# Patient Record
Sex: Male | Born: 1955 | Race: White | Hispanic: No | Marital: Married | Smoking: Current every day smoker
Health system: Southern US, Community
[De-identification: ages and names within clinical notes are randomized; demographics above are authoritative.]

## PROBLEM LIST (undated history)

## (undated) HISTORY — PX: JOINT REPLACEMENT: SHX530

## (undated) HISTORY — PX: CHOLECYSTECTOMY: SHX55

---

## 2006-04-22 ENCOUNTER — Encounter (INDEPENDENT_AMBULATORY_CARE_PROVIDER_SITE_OTHER): Payer: Self-pay | Admitting: *Deleted

## 2006-04-22 ENCOUNTER — Ambulatory Visit (HOSPITAL_COMMUNITY): Admission: RE | Admit: 2006-04-22 | Discharge: 2006-04-22 | Payer: Self-pay | Admitting: *Deleted

## 2007-04-06 ENCOUNTER — Encounter: Admission: RE | Admit: 2007-04-06 | Discharge: 2007-04-06 | Payer: Self-pay | Admitting: Internal Medicine

## 2008-05-03 ENCOUNTER — Ambulatory Visit (HOSPITAL_COMMUNITY): Admission: RE | Admit: 2008-05-03 | Discharge: 2008-05-03 | Payer: Self-pay | Admitting: *Deleted

## 2008-05-03 ENCOUNTER — Encounter (INDEPENDENT_AMBULATORY_CARE_PROVIDER_SITE_OTHER): Payer: Self-pay | Admitting: *Deleted

## 2008-07-07 ENCOUNTER — Encounter (INDEPENDENT_AMBULATORY_CARE_PROVIDER_SITE_OTHER): Payer: Self-pay | Admitting: General Surgery

## 2008-07-07 ENCOUNTER — Encounter: Payer: Self-pay | Admitting: Emergency Medicine

## 2008-07-07 ENCOUNTER — Inpatient Hospital Stay (HOSPITAL_COMMUNITY): Admission: EM | Admit: 2008-07-07 | Discharge: 2008-07-07 | Payer: Self-pay | Admitting: *Deleted

## 2011-02-23 NOTE — H&P (Signed)
NAME:  Erik Castro, Erik Castro              ACCOUNT NO.:  [id number]   MEDICAL RECORD NO.:  [id number]          PATIENT TYPE:  INP   LOCATION:  5154                         FACILITY:  [facility]   PHYSICIAN:  Wilmon Arms. Corliss Skains, M.D. DATE OF BIRTH:  04-01-1956   DATE OF ADMISSION:  07/07/2008  DATE OF DISCHARGE:                              HISTORY & PHYSICAL   PRIMARY CARE PHYSICIAN:  Juline Patch, MD   GASTROENTEROLOGIST:  Georgiana Spinner, MD   CHIEF COMPLAINT:  Epigastric pain.   HISTORY OF PRESENT ILLNESS:  The patient is a 55 year old male in  reasonably good health who presents with a 12-hour history of worsening  abdominal pain that began in his epigastrium with radiation through his  back.  He has also had some nausea.  The patient states that he was in  home, was working around the house when he began having some epigastric  pain.  He states that the pain slowly worsened in the evening.  Very  poor appetite for dinner.  He has also had some nausea, but no vomiting.  The patient thinks that in retrospect he has had similar milder episodes  of epigastric indigestion.  He has been diagnosed with  gastroesophageal reflux disease and has been followed by Dr. Sabino Gasser  for Barrett's esophagus.  His symptoms worsened to the point that he  presented to the [facility] Emergency Department where he was  evaluated.  A CT scan there was performed due to the unavailability of  ultrasound.  The CT scan showed prominence of the gallbladder wall with  some mild pericholecystic fluid and probable calcified gallstone in the  neck of the gallbladder.  The patient received some pain medicine as  well as antibiotics there and is currently asymptomatic.   PAST MEDICAL HISTORY:  Hypercholesterolemia, gastroesophageal reflux  disease with Barrett esophagus followed by Dr. Virginia Rochester.   PAST SURGICAL HISTORY:  Knee scopes.   FAMILY HISTORY:  Noncontributory.   SOCIAL HISTORY:  The patient smokes third of  pack a day and does not  drink alcohol.   ALLERGIES:  He is allergic to [facility].   MEDICATIONS:  Lipitor, Zegerid, and aspirin.   REVIEW OF SYSTEMS:  Otherwise negative.   PHYSICAL EXAMINATION:  VITAL SIGNS:  Temperature 98.2, pulse 54,  respirations 14, blood pressure 117/49.  GENERAL:  Well-developed, well-nourished male in no apparent distress.  HEENT:  EOMI.  Sclerae anicteric.  NECK:  No masses.  No thyromegaly.  LUNGS:  Clear.  Normal respiratory effort.  HEART:  Regular rate and rhythm.  No murmur.  ABDOMEN:  Mild epigastric tenderness.  No rebound or guarding.  No  palpable masses.  EXTREMITIES:  No edema.  SKIN:  Warm and dry.  No sign of jaundice.   LABORATORY DATA:  Lipase 109, white count 10.7, platelet count 294.  Electrolytes within normal limits.  Total bilirubin 0.3, alkaline  phosphatase 82.  CT of the abdomen and pelvis shows chronic gallbladder  wall with some pericholecystic fluid and possible calcified stone in the  neck of the gallbladder.  There is no sign  of intra or extrahepatic duct  dilatation.   IMPRESSION:  Early acute cholecystitis.   PLAN:  Admit for IV antibiotics, n.p.o. status.  We will proceed with a  laparoscopic cholecystectomy tomorrow.      Wilmon Arms. Tsuei, M.D.  Electronically Signed     MKT/MEDQ  D:  07/07/2008  T:  07/07/2008  Job:  [id number]

## 2011-02-23 NOTE — Op Note (Signed)
NAME:  Castro, Erik              ACCOUNT NO.:  [id number]   MEDICAL RECORD NO.:  [id number]          PATIENT TYPE:  INP   LOCATION:  5154                         FACILITY:  [facility]   PHYSICIAN:  Ollen Gross. Vernell Morgans, M.D. DATE OF BIRTH:  August 31, 1956   DATE OF PROCEDURE:  07/07/2008  DATE OF DISCHARGE:  07/07/2008                               OPERATIVE REPORT   PREOPERATIVE DIAGNOSIS:  Cholecystitis with cholelithiasis.   POSTOPERATIVE DIAGNOSIS:  Cholecystitis with cholelithiasis.   PROCEDURE:  Laparoscopic cholecystectomy with intraoperative  cholangiogram.   SURGEON:  Ollen Gross. Vernell Morgans, MD   ASSISTANT:  Anselm Pancoast. Zachery Dakins, MD   ANESTHESIA:  General endotracheal.   PROCEDURE:  After informed consent was obtained, the patient was brought  to the operating room and placed in a supine position on the operating  table.  After adequate induction of general anesthesia, the patient's  abdomen was prepped with Betadine and draped in usual sterile manner.  The area below the umbilicus was infiltrated with 0.25% Marcaine.  A  small incision was made with 15 blade knife and this incision was  carried down through the subcutaneous tissue bluntly with a hemostat and  Army-[employer]y retractors until the linea alba was identified.  The linea  alba was incised with a 15 blade knife and each side was grasped with  Kocher clamps and elevated anteriorly.  The preperitoneal space was then  probed bluntly with a hemostat until the peritoneum was opened and  access was gained to the abdominal cavity.  A 0-Vicryl pursestring  stitch was placed in the fascia around the opening.  Hasson cannula was  placed through the opening and anchored in place with the previously  placed Vicryl purse-string stitch.  The abdomen was then insufflated  with carbon dioxide without difficulty.  The patient was placed in  reverse Trendelenburg position and rotated with the right side up.  Next, the laparoscope was inserted  through the Hasson cannula and the  right upper quadrant was inspected.  The dome of the gallbladder and  liver were readily identified.  Next, the epigastric region was  infiltrated with 0.25% Marcaine.  A small incision was made with a #15  blade knife.  A 10-mm port was placed bluntly through this incision into  the abdominal cavity under direct vision.  Sites were then chosen  laterally on the right side with placement of 5-mm ports.  Each of these  areas were infiltrated with 0.25% Marcaine.  Small stab incisions were  made with a #15 blade knife and 5-mm ports were placed bluntly through  these incisions into the abdominal cavity under direct vision.  A blunt  grasper was placed through the lateral-most 5-mm port and used to grasp  the dome of gallbladder and elevated anteriorly and superiorly.  Another  blunt grasper was placed through the other 5-mm port and used to retract  on the body and neck of the gallbladder.  Dissector was placed through  the epigastric port and using the electrocautery, the peritoneal  reflection at the gallbladder neck was opened.  Blunt dissection was  then  carried within this area until the gallbladder neck and cystic duct  junction was readily identified and a good window was created.  A single  clip was placed on the gallbladder neck.  A small ductotomy was made  just below the clip with the laparoscopic scissors.  A 14-gauge  Angiocath was placed percutaneously through the anterior abdominal wall  under direct vision.  A Reddick cholangiogram catheter was placed  through the Angiocath and flushed.  The Reddick catheter was then placed  in the cystic duct and anchored in place with a clip.  Cholangiogram was  obtained that showed no filling defects, good emptying in duodenum, and  adequate length on the cystic duct.  The anchoring clip and catheters  were removed from the patient.  Three clips were placed proximal on the  cystic duct and duct was  divided between the 2 sets of clips.  Posterior  to this, the cystic artery was identified and again dissected bluntly in  a circumferential manner until a good window was created.  Two clips  placed were proximally and one distally on the artery and the artery was  divided between the two.  Next, the laparoscopic hook cautery device was  used to separate the gallbladder from liver bed.  There was a small  vessel in the gallbladder bed very close to the gallbladder wall that  was also controlled with some clips.  Prior to completely detaching the  gallbladder from the liver bed, the liver bed was inspected and several  small bleeding points were coagulated with electrocautery until the area  was completely hemostatic.  The gallbladder was then detached rest away  from the liver bed without difficulty with the hook cautery.  A  laparoscopic bag was inserted through the epigastric port.  The  gallbladder was placed in the bag and the bag was sealed.  The abdomen  was then irrigated with copious amounts of saline until the effluent was  clear.  The laparoscope was then moved to the epigastric port.  A  gallbladder grasper was placed through the Hasson cannula and used to  grasp the opening of the bag.  The bag with the gallbladder was removed  through the infraumbilical port without difficulty.  The fascial defect  was closed with the previously placed Vicryl purse-string stitch as well  as with another figure-of-eight interrupted 0-Vicryl stitch.  The rest  of ports were removed under direct vision and found to be hemostatic.  Gas was allowed to escape.  The skin incisions were all closed with  interrupted 4-0 Monocryl subcuticular stitches and Dermabond dressings  were applied.  The patient tolerated the procedure well.  At the end of  the case, all needle, sponge, and instrument counts were correct.  The  patient was then awakened and taken to recovery room in stable  condition.       Ollen Gross. Vernell Morgans, M.D.  Electronically Signed     PST/MEDQ  D:  07/07/2008  T:  07/07/2008  Job:  [id number]

## 2011-02-23 NOTE — Op Note (Signed)
NAME:  Erik Castro, Erik Castro              ACCOUNT NO.:  [id number]   MEDICAL RECORD NO.:  [id number]          PATIENT TYPE:  AMB   LOCATION:  ENDO                         FACILITY:  [facility]   PHYSICIAN:  Georgiana Spinner, M.D.    DATE OF BIRTH:  10/01/1956   DATE OF PROCEDURE:  DATE OF DISCHARGE:                               OPERATIVE REPORT   PROCEDURE:  Upper endoscopy with biopsy.   INDICATIONS:  Barrett's esophagus.   ANESTHESIA:  Fentanyl 50 mcg, Versed 6 mg.   DESCRIPTION OF PROCEDURE:  With the patient mildly sedated in the left  lateral decubitus position, the Pentax videoscopic endoscope was  inserted in the mouth and passed under direct vision through the  esophagus until we reached the distal esophagus, and there were changes  of Barrett's esophagus and also what appeared to be a ring, Schatzki's  type.  This was photographed.  The area of Barrett's was biopsied.  There were two particular areas seen.  We entered into the stomach.  The  fundus, body, antrum, duodenal bulb and second portion of the duodenum  were visualized.  From this point, the endoscope was slowly withdrawn,  taking circumferential views of the duodenal mucosa until the endoscope  had been pulled back into the stomach and placed in retroflexion to view  the stomach from below.  The endoscope was straightened and withdrawn,  taking circumferential views of the remaining gastric and esophageal  mucosa, stopping in the proximal esophagus where what appeared to be a  papilloma was photographed and biopsied.  The endoscope was withdrawn.  The patient's vital signs and pulse oximeter remained stable.  The  patient tolerated the procedure well without apparent complication.   FINDINGS:  Proximal esophageal papilloma, biopsied.  Distal esophagus  Barrett's esophagus biopsied along with a Schatzki's ring which was  photographed only.  A hiatal hernia was noted.   PLAN:  Await biopsy report.  The patient will call me  for results and  follow-up with me as an outpatient.           ______________________________  Georgiana Spinner, M.D.     GMO/MEDQ  D:  05/03/2008  T:  05/03/2008  Job:  [id number]

## 2011-02-26 ENCOUNTER — Emergency Department (INDEPENDENT_AMBULATORY_CARE_PROVIDER_SITE_OTHER): Payer: 59

## 2011-02-26 ENCOUNTER — Emergency Department (HOSPITAL_BASED_OUTPATIENT_CLINIC_OR_DEPARTMENT_OTHER)
Admission: EM | Admit: 2011-02-26 | Discharge: 2011-02-26 | Disposition: A | Discharge status: Home / Self Care | Payer: 59 | Attending: Emergency Medicine | Admitting: Emergency Medicine

## 2011-02-26 DIAGNOSIS — W298XXA Contact with other powered powered hand tools and household machinery, initial encounter: Secondary | ICD-10-CM | POA: Insufficient documentation

## 2011-02-26 DIAGNOSIS — Y92009 Unspecified place in unspecified non-institutional (private) residence as the place of occurrence of the external cause: Secondary | ICD-10-CM | POA: Insufficient documentation

## 2011-02-26 DIAGNOSIS — S61209A Unspecified open wound of unspecified finger without damage to nail, initial encounter: Secondary | ICD-10-CM

## 2011-02-26 DIAGNOSIS — F17200 Nicotine dependence, unspecified, uncomplicated: Secondary | ICD-10-CM | POA: Insufficient documentation

## 2011-02-26 DIAGNOSIS — S61409A Unspecified open wound of unspecified hand, initial encounter: Secondary | ICD-10-CM | POA: Insufficient documentation

## 2011-02-26 DIAGNOSIS — E785 Hyperlipidemia, unspecified: Secondary | ICD-10-CM | POA: Insufficient documentation

## 2011-02-26 DIAGNOSIS — K219 Gastro-esophageal reflux disease without esophagitis: Secondary | ICD-10-CM | POA: Insufficient documentation

## 2011-02-26 DIAGNOSIS — W312XXA Contact with powered woodworking and forming machines, initial encounter: Secondary | ICD-10-CM

## 2011-02-26 NOTE — Op Note (Signed)
NAME:  Erik Castro, Erik Castro              ACCOUNT NO.:  [id number]   MEDICAL RECORD NO.:  [id number]          PATIENT TYPE:  AMB   LOCATION:  ENDO                         FACILITY:  [facility]   PHYSICIAN:  Georgiana Spinner, M.D.    DATE OF BIRTH:  1956/02/12   DATE OF PROCEDURE:  DATE OF DISCHARGE:                                 OPERATIVE REPORT   PROCEDURE:  Colonoscopy.   INDICATIONS:  Colon cancer screening.   ANESTHESIA:  Fentanyl 25 mcg, Versed 2 mg.   PROCEDURE:  With the patient mildly sedated in the left lateral decubitus  position, the Olympus videoscopic colonoscope was inserted in the rectum  after a rectal examination was performed and was unremarkable.  Subsequently, the scope was passed under direct vision with pressure applied  to the abdomen, we reached the cecum, identified by ileocecal valve and  appendiceal orifice both of which were photographed.  From this point, the  colonoscope was slowly withdrawn taking circumferential views of colonic  mucosa stopping only in the rectum, which appeared normal on direct  and  showed hemorrhoids on retroflex view.  The endoscope was straightened and  withdrawn.  The patient's vital signs, pulse oximeter remained stable.  The  patient tolerated procedure well with no apparent complications.   FINDINGS:  Internal hemorrhoids otherwise unremarkable colonoscopic  examination to the cecum.   PLAN:  See endoscopy note for further details.           ______________________________  Georgiana Spinner, M.D.     GMO/MEDQ  D:  04/22/2006  T:  04/22/2006  Job:  [id number]

## 2011-02-26 NOTE — Op Note (Signed)
NAME:  Erik Castro, Erik Castro              ACCOUNT NO.:  [id number]   MEDICAL RECORD NO.:  [id number]          PATIENT TYPE:  AMB   LOCATION:  ENDO                         FACILITY:  [facility]   PHYSICIAN:  Georgiana Spinner, M.D.    DATE OF BIRTH:  1956/01/20   DATE OF PROCEDURE:  04/22/2006  DATE OF DISCHARGE:                                 OPERATIVE REPORT   PROCEDURE:  Upper endoscopy.   INDICATIONS:  GERD.   ANESTHESIA:  Fentanyl 100 mcg, Versed 10 mg.   PROCEDURE:  With the patient mildly sedated in the left lateral decubitus  position, the Olympus videoscopic endoscope was inserted in the mouth,  passed under direct vision through the esophagus and at midesophagus, there  appeared to be a papilloma, which was photographed and subsequently  biopsied.  We advanced past this to the distal esophagus, and there appeared  to be an early stricture.  The patient had some dysphagia, which had cleared  on Zegerid therapy, and there were also changes of Barrett's esophagus,  photographed and biopsied.  We entered into the stomach through a hiatal  hernia.  The fundus, body, antrum, duodenal bulb, second portion of duodenum  were visualized.  From this point the endoscope was slowly withdrawn taking  circumferential views of the duodenal mucosa until the endoscope had been  pulled back into the stomach, placed in retroflexion to view the stomach  from below.  The endoscope was straightened and withdrawn taking  circumferential views of the remaining gastric and esophageal mucosa.  The  patient's vital signs and pulse oximetry remained stable.  The patient  tolerated procedure well without apparent complications.   FINDINGS:  1.  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  2.  Barrett's esophagus, biopsied.  3.  A papilloma, presumably biopsied.   Await biopsy reports.  The patient will call me for results and follow up  with me as an outpatient.  Proceed to colonoscopy as  planned.           ______________________________  Georgiana Spinner, M.D.     GMO/MEDQ  D:  04/22/2006  T:  04/22/2006  Job:  [id number]

## 2011-03-01 ENCOUNTER — Emergency Department (HOSPITAL_BASED_OUTPATIENT_CLINIC_OR_DEPARTMENT_OTHER)
Admission: EM | Admit: 2011-03-01 | Discharge: 2011-03-01 | Disposition: A | Discharge status: Home / Self Care | Payer: 59 | Attending: Emergency Medicine | Admitting: Emergency Medicine

## 2011-03-01 DIAGNOSIS — E785 Hyperlipidemia, unspecified: Secondary | ICD-10-CM | POA: Insufficient documentation

## 2011-03-01 DIAGNOSIS — K219 Gastro-esophageal reflux disease without esophagitis: Secondary | ICD-10-CM | POA: Insufficient documentation

## 2011-03-01 DIAGNOSIS — Z09 Encounter for follow-up examination after completed treatment for conditions other than malignant neoplasm: Secondary | ICD-10-CM | POA: Insufficient documentation

## 2011-03-01 DIAGNOSIS — F17200 Nicotine dependence, unspecified, uncomplicated: Secondary | ICD-10-CM | POA: Insufficient documentation

## 2011-07-12 LAB — DIFFERENTIAL
Basophils Absolute: 0.1
Basophils Relative: 1
Eosinophils Relative: 1
Lymphocytes Relative: 22

## 2011-07-12 LAB — CBC
HCT: 41.3
MCV: 91.7
RBC: 4.51
WBC: 10.7 — ABNORMAL HIGH

## 2011-07-12 LAB — COMPREHENSIVE METABOLIC PANEL
AST: 23
Alkaline Phosphatase: 82
BUN: 13
CO2: 28
Chloride: 105
Creatinine, Ser: 1
GFR calc non Af Amer: >60
Total Bilirubin: 0.3

## 2011-07-12 LAB — LIPASE, BLOOD: Lipase: 109

## 2011-07-12 LAB — POCT CARDIAC MARKERS: Troponin i, poc: <0.05

## 2014-05-14 ENCOUNTER — Other Ambulatory Visit: Payer: Self-pay | Admitting: Internal Medicine

## 2014-05-14 DIAGNOSIS — F17200 Nicotine dependence, unspecified, uncomplicated: Secondary | ICD-10-CM

## 2014-05-22 ENCOUNTER — Ambulatory Visit
Admission: RE | Admit: 2014-05-22 | Discharge: 2014-05-22 | Disposition: A | Discharge status: Home / Self Care | Payer: No Typology Code available for payment source | Source: Ambulatory Visit | Attending: Internal Medicine | Admitting: Internal Medicine

## 2014-05-22 DIAGNOSIS — F17200 Nicotine dependence, unspecified, uncomplicated: Secondary | ICD-10-CM

## 2014-06-28 ENCOUNTER — Other Ambulatory Visit: Payer: Self-pay | Admitting: Gastroenterology

## 2018-05-12 ENCOUNTER — Other Ambulatory Visit: Payer: Self-pay | Admitting: Gastroenterology

## 2018-05-30 ENCOUNTER — Encounter (HOSPITAL_COMMUNITY): Payer: Self-pay | Admitting: *Deleted

## 2018-05-30 ENCOUNTER — Other Ambulatory Visit: Payer: Self-pay | Admitting: Gastroenterology

## 2018-05-30 NOTE — Anesthesia Preprocedure Evaluation (Signed)
Anesthesia Evaluation  Patient identified by MRN, date of birth, ID band Patient awake    Reviewed: Allergy & Precautions, H&P , NPO status , Patient's Chart, lab work & pertinent test results, reviewed documented beta blocker date and time   Airway Mallampati: II  TM Distance: >3 FB Neck ROM: full    Dental no notable dental hx.    Pulmonary neg pulmonary ROS, Current Smoker,    Pulmonary exam normal breath sounds clear to auscultation       Cardiovascular Exercise Tolerance: Good negative cardio ROS   Rhythm:regular Rate:Normal     Neuro/Psych negative neurological ROS  negative psych ROS   GI/Hepatic negative GI ROS, Neg liver ROS, GERD  ,  Endo/Other  negative endocrine ROS  Renal/GU negative Renal ROS  negative genitourinary   Musculoskeletal   Abdominal   Peds  Hematology negative hematology ROS (+)   Anesthesia Other Findings   Reproductive/Obstetrics negative OB ROS                             Anesthesia Physical Anesthesia Plan  ASA: II  Anesthesia Plan: MAC   Post-op Pain Management:    Induction: Intravenous  PONV Risk Score and Plan: 1 and Treatment may vary due to age or medical condition  Airway Management Planned: Mask, Natural Airway and Nasal Cannula  Additional Equipment:   Intra-op Plan:   Post-operative Plan:   Informed Consent: I have reviewed the patients History and Physical, chart, labs and discussed the procedure including the risks, benefits and alternatives for the proposed anesthesia with the patient or authorized representative who has indicated his/her understanding and acceptance.   Dental Advisory Given  Plan Discussed with: CRNA, Anesthesiologist and Surgeon  Anesthesia Plan Comments:         Anesthesia Quick Evaluation

## 2018-05-31 ENCOUNTER — Ambulatory Visit (HOSPITAL_COMMUNITY): Payer: 59 | Admitting: Certified Registered Nurse Anesthetist

## 2018-05-31 ENCOUNTER — Encounter (HOSPITAL_COMMUNITY)
Admission: RE | Disposition: A | Discharge status: Home / Self Care | Payer: Self-pay | Source: Ambulatory Visit | Attending: Gastroenterology

## 2018-05-31 ENCOUNTER — Other Ambulatory Visit: Payer: Self-pay

## 2018-05-31 ENCOUNTER — Ambulatory Visit (HOSPITAL_COMMUNITY)
Admission: RE | Admit: 2018-05-31 | Discharge: 2018-05-31 | Disposition: A | Discharge status: Home / Self Care | Payer: 59 | Source: Ambulatory Visit | Attending: Gastroenterology | Admitting: Gastroenterology

## 2018-05-31 ENCOUNTER — Encounter (HOSPITAL_COMMUNITY): Payer: Self-pay

## 2018-05-31 DIAGNOSIS — F17200 Nicotine dependence, unspecified, uncomplicated: Secondary | ICD-10-CM | POA: Insufficient documentation

## 2018-05-31 DIAGNOSIS — K219 Gastro-esophageal reflux disease without esophagitis: Secondary | ICD-10-CM | POA: Diagnosis not present

## 2018-05-31 DIAGNOSIS — Z888 Allergy status to other drugs, medicaments and biological substances status: Secondary | ICD-10-CM | POA: Insufficient documentation

## 2018-05-31 DIAGNOSIS — D130 Benign neoplasm of esophagus: Secondary | ICD-10-CM | POA: Insufficient documentation

## 2018-05-31 DIAGNOSIS — K2970 Gastritis, unspecified, without bleeding: Secondary | ICD-10-CM | POA: Diagnosis not present

## 2018-05-31 DIAGNOSIS — Z7982 Long term (current) use of aspirin: Secondary | ICD-10-CM | POA: Insufficient documentation

## 2018-05-31 DIAGNOSIS — Z79899 Other long term (current) drug therapy: Secondary | ICD-10-CM | POA: Insufficient documentation

## 2018-05-31 DIAGNOSIS — K449 Diaphragmatic hernia without obstruction or gangrene: Secondary | ICD-10-CM | POA: Insufficient documentation

## 2018-05-31 HISTORY — PX: ESOPHAGOGASTRODUODENOSCOPY (EGD) WITH PROPOFOL: SHX5813

## 2018-05-31 HISTORY — PX: POLYPECTOMY: SHX5525

## 2018-05-31 SURGERY — ESOPHAGOGASTRODUODENOSCOPY (EGD) WITH PROPOFOL
Anesthesia: Monitor Anesthesia Care

## 2018-05-31 MED ORDER — LIDOCAINE 2% (20 MG/ML) 5 ML SYRINGE
INTRAMUSCULAR | Status: DC | PRN
  Administered 2018-05-31: 80 mg via INTRAVENOUS

## 2018-05-31 MED ORDER — ONDANSETRON HCL 4 MG/2ML IJ SOLN
INTRAMUSCULAR | Status: DC | PRN
  Administered 2018-05-31: 4 mg via INTRAVENOUS

## 2018-05-31 MED ORDER — LACTATED RINGERS IV SOLN
INTRAVENOUS | Status: DC
  Administered 2018-05-31: 08:00:00 via INTRAVENOUS

## 2018-05-31 MED ORDER — SODIUM CHLORIDE 0.9 % IV SOLN: INTRAVENOUS | Status: DC

## 2018-05-31 MED ORDER — GLYCOPYRROLATE 0.2 MG/ML IJ SOLN
INTRAMUSCULAR | Status: DC | PRN
  Administered 2018-05-31: 0.2 mg via INTRAVENOUS

## 2018-05-31 MED ORDER — PROPOFOL 500 MG/50ML IV EMUL
INTRAVENOUS | Status: DC | PRN
  Administered 2018-05-31: 100 ug/kg/min via INTRAVENOUS

## 2018-05-31 MED ORDER — PROPOFOL 10 MG/ML IV BOLUS
INTRAVENOUS | Status: DC | PRN
  Administered 2018-05-31: 30 mg via INTRAVENOUS

## 2018-05-31 MED ORDER — PROPOFOL 10 MG/ML IV BOLUS
INTRAVENOUS | Status: AC
  Filled 2018-05-31: qty 40

## 2018-05-31 SURGICAL SUPPLY — 14 items

## 2018-05-31 NOTE — Transfer of Care (Signed)
Immediate Anesthesia Transfer of Care Note  Patient: Erik Castro  Procedure(s) Performed: ESOPHAGOGASTRODUODENOSCOPY (EGD) WITH PROPOFOL (N/A ) POLYPECTOMY  Patient Location: PACU  Anesthesia Type:MAC  Level of Consciousness: awake, alert , oriented and patient cooperative  Airway & Oxygen Therapy: Patient Spontanous Breathing and Patient connected to nasal cannula oxygen  Post-op Assessment: Report given to RN and Post -op Vital signs reviewed and stable  Post vital signs: Reviewed and stable  Last Vitals:  Vitals Value Taken Time  BP    Temp    Pulse    Resp    SpO2      Last Pain:  Vitals:   05/31/18 0806  TempSrc: Oral  PainSc: 0-No pain         Complications: No apparent anesthesia complications

## 2018-05-31 NOTE — Discharge Instructions (Signed)

## 2018-05-31 NOTE — Op Note (Signed)
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 Estimated Blood Loss:     Estimated blood loss: none. Procedure:                Pre-Anesthesia Assessment:                           - Prior to the procedure, a History and Physical                            was performed, and patient medications and                            allergies were reviewed. The patient's tolerance of                            previous anesthesia was also reviewed. The risks                            and benefits of the procedure and the sedation                            options and risks were discussed with the patient.                            All questions were answered, and informed consent                            was obtained. Prior Anticoagulants: The patient has                            taken aspirin. ASA Grade Assessment: II - A patient                            with mild systemic disease. After reviewing the  risks and benefits, the patient was deemed in                            satisfactory condition to undergo the procedure.                           After obtaining informed consent, the endoscope was                            passed under direct vision. Throughout the                            procedure, the patient's blood pressure, pulse, and             oxygen saturations were monitored continuously. The                            GIF-H190 ([id number]) Olympus adult endoscope was                            introduced through the mouth, and advanced to the                            second part of duodenum. The upper GI endoscopy was                            accomplished without difficulty. The patient                            tolerated the procedure well. Scope In: Scope Out: Findings:      A single 6 mm polyp, frond-like and on small stalk with no bleeding was       found 30 cm from the incisors. The polyp was removed with a hot snare.       Resection and retrieval were complete. Estimated blood loss: none.      A small hiatal hernia was present.      The exam of the esophagus was otherwise normal.      Patchy minimal inflammation was found in the entire examined stomach.      The exam of the stomach was otherwise normal.      The duodenal bulb, first portion of the duodenum and second portion of       the duodenum were normal. Impression:               - Esophageal polyp found. Resected and retrieved.                           - Small hiatal hernia.                           - Gastritis.                           - Normal duodenal bulb, first portion of the  duodenum and second portion of the duodenum. Moderate Sedation:      N/A- Per Anesthesia Care Recommendation:           - Patient has a contact number available for                            emergencies. The signs and symptoms of potential                            delayed complications were discussed with the                            patient. Return to normal activities tomorrow.                            Written discharge instructions were provided to the                            patient.                           - Discharge patient to home (ambulatory).                           - Resume previous diet today.                            - Continue present medications.                           - Await pathology results.                           - Repeat upper endoscopy (date not yet determined)                            for surveillance based on pathology results. May                            consider follow-up endoscopy in 1 year.                           - Return to GI clinic PRN.                           - Return to referring physician as previously                            scheduled. Procedure Code(s):        --- Professional ---                           [phone], Esophagogastroduodenoscopy, flexible,                            transoral; with removal of tumor(s), polyp(s), or  other lesion(s) by snare technique Diagnosis Code(s):        --- Professional ---                           K22.8, Other specified diseases of esophagus                           K44.9, Diaphragmatic hernia without obstruction or                            gangrene                           K29.70, Gastritis, unspecified, without bleeding                           D13.0, Benign neoplasm of esophagus CPT copyright 2017 [employer]. All rights reserved. The codes documented in this report are preliminary and upon coder review may  be revised to meet current compliance requirements. Arta Silence, MD 05/31/2018 9:06:34 AM This report has been signed electronically. Number of Addenda: 0

## 2018-05-31 NOTE — Anesthesia Postprocedure Evaluation (Signed)
Anesthesia Post Note  Patient: Erik Castro  Procedure(s) Performed: ESOPHAGOGASTRODUODENOSCOPY (EGD) WITH PROPOFOL (N/A ) POLYPECTOMY     Patient location during evaluation: PACU Anesthesia Type: MAC Level of consciousness: awake and alert Pain management: pain level controlled Vital Signs Assessment: post-procedure vital signs reviewed and stable Respiratory status: spontaneous breathing, nonlabored ventilation, respiratory function stable and patient connected to nasal cannula oxygen Cardiovascular status: stable and blood pressure returned to baseline Postop Assessment: no apparent nausea or vomiting Anesthetic complications: no    Last Vitals:  Vitals:   05/31/18 0925 05/31/18 0930  BP: (!) 119/52   Pulse: (!) 56   Resp: 17   Temp:    SpO2: 98% 100%    Last Pain:  Vitals:   05/31/18 0911  TempSrc: Oral  PainSc: 0-No pain                 Alannie Amodio

## 2018-05-31 NOTE — H&P (Signed)
Patient interval history reviewed.  Patient examined again.  There has been no change from documented H/P dated 05/30/18 (scanned into chart from our office) except as documented above.  Assessment:  1.  Esophageal polyp (squamous papilloma).  Plan:  1.  Endoscopy with anticipated esophageal polyp removal. 2.  Risks (bleeding, infection, bowel perforation that could require surgery, sedation-related changes in cardiopulmonary systems), benefits (identification and possible treatment of source of symptoms, exclusion of certain causes of symptoms), and alternatives (watchful waiting, radiographic imaging studies, empiric medical treatment) of upper endoscopy (EGD) were explained to patient/family in detail and patient wishes to proceed.

## 2018-05-31 NOTE — Anesthesia Procedure Notes (Signed)
Procedure Name: MAC Date/Time: 05/31/2018 8:41 AM Performed by: West Pugh, CRNA Pre-anesthesia Checklist: Patient identified, Emergency Drugs available, Suction available, Patient being monitored and Timeout performed Patient Re-evaluated:Patient Re-evaluated prior to induction Oxygen Delivery Method: Nasal cannula Preoxygenation: Pre-oxygenation with 100% oxygen Induction Type: IV induction Placement Confirmation: positive ETCO2 Dental Injury: Teeth and Oropharynx as per pre-operative assessment

## 2018-06-01 ENCOUNTER — Encounter (HOSPITAL_COMMUNITY): Payer: Self-pay | Admitting: Gastroenterology

## 2020-03-14 ENCOUNTER — Encounter (HOSPITAL_COMMUNITY): Payer: Self-pay | Admitting: Emergency Medicine

## 2020-03-14 ENCOUNTER — Other Ambulatory Visit: Payer: Self-pay

## 2020-03-14 ENCOUNTER — Emergency Department (HOSPITAL_COMMUNITY): Payer: 59

## 2020-03-14 ENCOUNTER — Emergency Department (HOSPITAL_COMMUNITY)
Admission: EM | Admit: 2020-03-14 | Discharge: 2020-03-14 | Disposition: A | Discharge status: Home / Self Care | Payer: 59 | Attending: Emergency Medicine | Admitting: Emergency Medicine

## 2020-03-14 DIAGNOSIS — W208XXA Other cause of strike by thrown, projected or falling object, initial encounter: Secondary | ICD-10-CM | POA: Insufficient documentation

## 2020-03-14 DIAGNOSIS — Z79899 Other long term (current) drug therapy: Secondary | ICD-10-CM | POA: Insufficient documentation

## 2020-03-14 DIAGNOSIS — Z7982 Long term (current) use of aspirin: Secondary | ICD-10-CM | POA: Diagnosis not present

## 2020-03-14 DIAGNOSIS — R52 Pain, unspecified: Secondary | ICD-10-CM

## 2020-03-14 DIAGNOSIS — Y999 Unspecified external cause status: Secondary | ICD-10-CM | POA: Diagnosis not present

## 2020-03-14 DIAGNOSIS — F17210 Nicotine dependence, cigarettes, uncomplicated: Secondary | ICD-10-CM | POA: Diagnosis not present

## 2020-03-14 DIAGNOSIS — S92421A Displaced fracture of distal phalanx of right great toe, initial encounter for closed fracture: Secondary | ICD-10-CM | POA: Insufficient documentation

## 2020-03-14 DIAGNOSIS — Y929 Unspecified place or not applicable: Secondary | ICD-10-CM | POA: Insufficient documentation

## 2020-03-14 DIAGNOSIS — Y939 Activity, unspecified: Secondary | ICD-10-CM | POA: Insufficient documentation

## 2020-03-14 DIAGNOSIS — S99921A Unspecified injury of right foot, initial encounter: Secondary | ICD-10-CM | POA: Diagnosis present

## 2020-03-14 MED ORDER — HYDROCODONE-ACETAMINOPHEN 5-325 MG PO TABS
1.0000 | ORAL_TABLET | Freq: Once | ORAL | Status: AC
  Administered 2020-03-14: 1 via ORAL
  Filled 2020-03-14: qty 1

## 2020-03-14 MED ORDER — HYDROCODONE-ACETAMINOPHEN 5-325 MG PO TABS: 1.0000 | ORAL_TABLET | Freq: Four times a day (QID) | ORAL | 0 refills | Status: AC | PRN

## 2020-03-14 NOTE — ED Notes (Signed)
Patient Alert and oriented to baseline. Stable and ambulatory to baseline. Patient verbalized understanding of the discharge instructions.  Patient belongings were taken by the patient.   

## 2020-03-14 NOTE — Discharge Instructions (Addendum)
Please read instructions below. Keep the splint clean, dry and in place at all times. Avoid weight bearing on your foot until cleared by orthopedics. Elevate your foot as much as possible. You can take hydrocodone every 6 hours as needed for severe pain. Be aware there is tylenol/acetaminophen in this medication. Hydrocodone can make you drowsy, don't drive while taking. Call the orthopedic specialist office the next business day to schedule an appointment for repeat xray and follow up on your injury. Return to the ED for concerning symptoms.

## 2020-03-14 NOTE — ED Triage Notes (Addendum)
Patient arrives to ED with complaints of an injury to his right big toe after a log fell on it yesterday.

## 2020-03-14 NOTE — ED Provider Notes (Signed)
[facility] Provider Note   CSN: [id number] Arrival date & time: 03/14/20  1157     History Chief Complaint  Patient presents with  . Toe Injury    Erik Castro is a 64 y.o. male presenting to the emergency department with complaint of right great toe injury that occurred yesterday.  He states a wooden pallet fell onto his foot.  He had sudden onset of pain with gradual onset of swelling overnight.  He has associated bruising.  He woke up with blistering to the top of his toe.  One of the blisters burst while in the waiting room here.  He provides a picture of his toe soon after injury, there is no wound present.  Patient confirms there were no wounds initially after injury.  He has applied ice.  He went to urgent care to be seen however they sent him to the ED for evaluation. Denies hx of DM or vascular insufficiency.  The history is provided by the patient.       History reviewed. No pertinent past medical history.  There are no problems to display for this patient.   Past Surgical History:  Procedure Laterality Date  . CHOLECYSTECTOMY    . ESOPHAGOGASTRODUODENOSCOPY (EGD) WITH PROPOFOL N/A 05/31/2018   Procedure: ESOPHAGOGASTRODUODENOSCOPY (EGD) WITH PROPOFOL;  Surgeon: Arta Silence, MD;  Location: [facility];  Service: Endoscopy;  Laterality: N/A;  . JOINT REPLACEMENT    . POLYPECTOMY  05/31/2018   Procedure: POLYPECTOMY;  Surgeon: Arta Silence, MD;  Location: [facility];  Service: Endoscopy;;       History reviewed. No pertinent family history.  Social History   Tobacco Use  . Smoking status: Current Every Day Smoker    Packs/day: 0.25    Years: 40.00    Pack years: 10.00    Types: Cigarettes  . Smokeless tobacco: Never Used  Substance Use Topics  . Alcohol use: Never  . Drug use: Never    Home Medications Prior to Admission medications   Medication Sig Start Date End Date Taking? Authorizing Provider    aspirin EC 81 MG tablet Take 81 mg by mouth daily.    [provider]  HYDROcodone-acetaminophen (NORCO/VICODIN) 5-325 MG tablet Take 1-2 tablets by mouth every 6 (six) hours as needed for severe pain. 03/14/20   Garlon Tuggle, [place] N, PA-C  loperamide (IMODIUM A-D) 2 MG tablet Take 2 mg by mouth daily.    [provider]  pantoprazole (PROTONIX) 40 MG tablet Take 40 mg by mouth daily.    [provider]    Allergies    Wellbutrin [bupropion]  Review of Systems   Review of Systems  Musculoskeletal: Positive for arthralgias.  Skin: Positive for color change.    Physical Exam Updated Vital Signs BP (!) 170/88   Pulse (!) 56   Temp 98.5 F (36.9 C) (Oral)   Resp 16   Ht 6\' 1"  (1.854 m)   Wt 78.9 kg   SpO2 98%   BMI 22.96 kg/m   Physical Exam Vitals and nursing note reviewed.  Constitutional:      General: He is not in acute distress.    Appearance: He is well-developed.  HENT:     Head: Normocephalic and atraumatic.  Eyes:     Conjunctiva/sclera: Conjunctivae normal.  Cardiovascular:     Rate and Rhythm: Normal rate.  Pulmonary:     Effort: Pulmonary effort is normal.  Musculoskeletal:     Comments: Right first  toe with bruising along the dorsal aspect. No deformity. Localized swelling to the toe. There are some intact blisters as well as one small blister that has been deroofed, draining serous fluid.  Unable to range toe secondary to pain. Slight bruising to the distal aspects of the second and third digits.  Neurological:     Mental Status: He is alert.  Psychiatric:        Mood and Affect: Mood normal.        Behavior: Behavior normal.     ED Results / Procedures / Treatments   Labs (all labs ordered are listed, but only abnormal results are displayed) Labs Reviewed - No data to display  EKG None  Radiology DG Foot Complete Right  Result Date: 03/14/2020 CLINICAL DATA:  Pain after heavy object fell on foot EXAM: RIGHT FOOT  COMPLETE - 3+ VIEW COMPARISON:  None. FINDINGS: Frontal, oblique, and lateral views were obtained. There is a fracture along the distal aspect of the first distal phalanx with displacement of fracture fragments in this area. In addition, there are fractures along the proximal aspects of each first distal phalanx. No other fractures are evident. No dislocation. There is moderate narrowing of the first MTP joint with spurring. There is mild spurring in the second DIP joint. There is also mild spurring in the first IP joint. No erosive change. IMPRESSION: Fractures along the proximal and distal aspects of the first distal phalanx. Mild displacement of fracture fragments noted. No other fractures are evident. No dislocation. 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 Electronically Signed   By: Lowella Grip III M.D.   On: 03/14/2020 12:50    Procedures Procedures (including critical care time)  Medications Ordered in ED Medications  HYDROcodone-acetaminophen (NORCO/VICODIN) 5-325 MG per tablet 1 tablet (has no administration in time range)    ED Course  I have reviewed the triage vital signs and the nursing notes.  Pertinent labs & imaging results that were available during my care of the patient were reviewed by me and considered in my medical decision making (see chart for details).    MDM Rules/Calculators/A&P                      Patient with displaced fracture of the distal right first phalanx after a wood pallet fell on his foot yesterday.  He developed blistering overnight which I suspect is secondary to swelling.  He provides image on his phone of his toe after injury, there were no open wounds at the time.  This is a closed fracture.  Will place in postop shoe and provide crutches for nonweightbearing.  Pain medication, ice, elevation discussed.  Orthopedic referral provided for follow-up.  Encourage good wound care.  Safe for  discharge.  Discussed results, findings, treatment and follow up. Patient advised of return precautions. Patient verbalized understanding and agreed with plan.  [place] Controlled Substance reporting System queried  Final Clinical Impression(s) / ED Diagnoses Final diagnoses:  Closed displaced fracture of distal phalanx of right great toe, initial encounter    Rx / DC Orders ED Discharge Orders         Ordered    HYDROcodone-acetaminophen (NORCO/VICODIN) 5-325 MG tablet  Every 6 hours PRN     03/14/20 1540           Alicyn Klann, [place] N, [place] 03/14/20 1550    Dorie Rank, MD 03/15/20 [phone]

## 2020-08-30 IMAGING — CR DG FOOT COMPLETE 3+V*R*
4 series · 4 of 4 positions shown · non-contrast
Comparison: None.

CLINICAL DATA: Pain after heavy object fell on foot

EXAM:
RIGHT FOOT COMPLETE - 3+ VIEW

[foot ap]
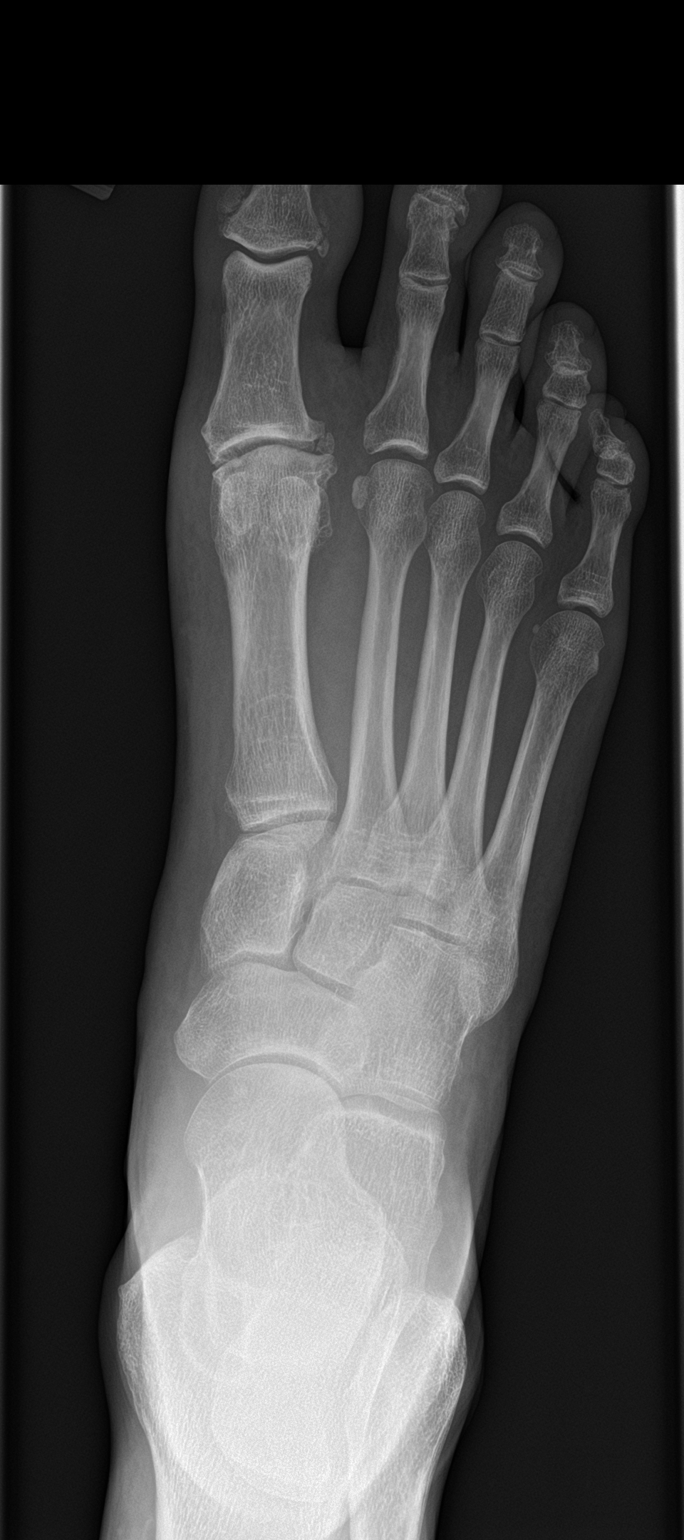

[foot obl]
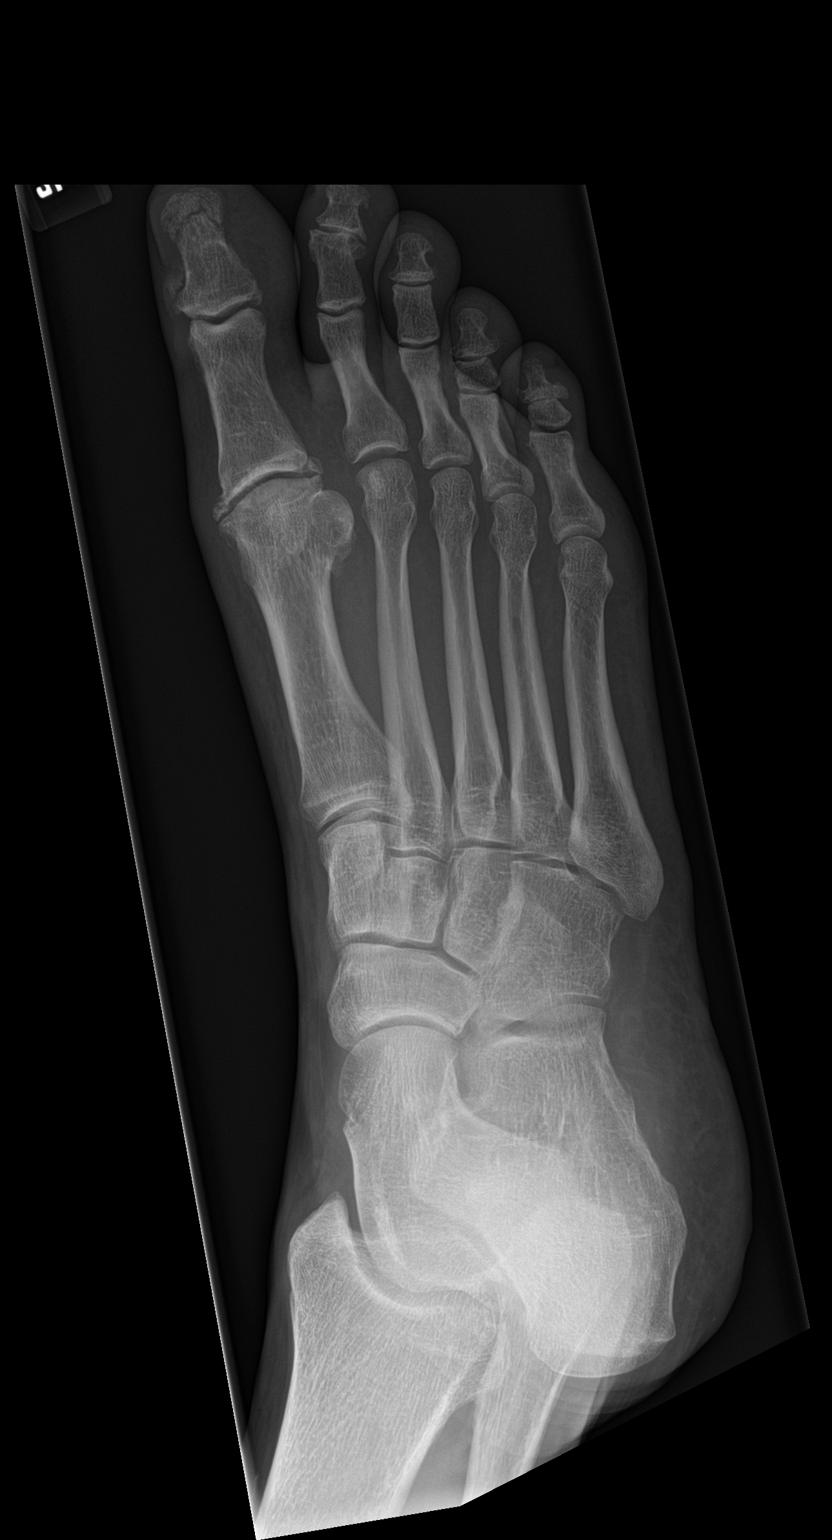

[foot lat]
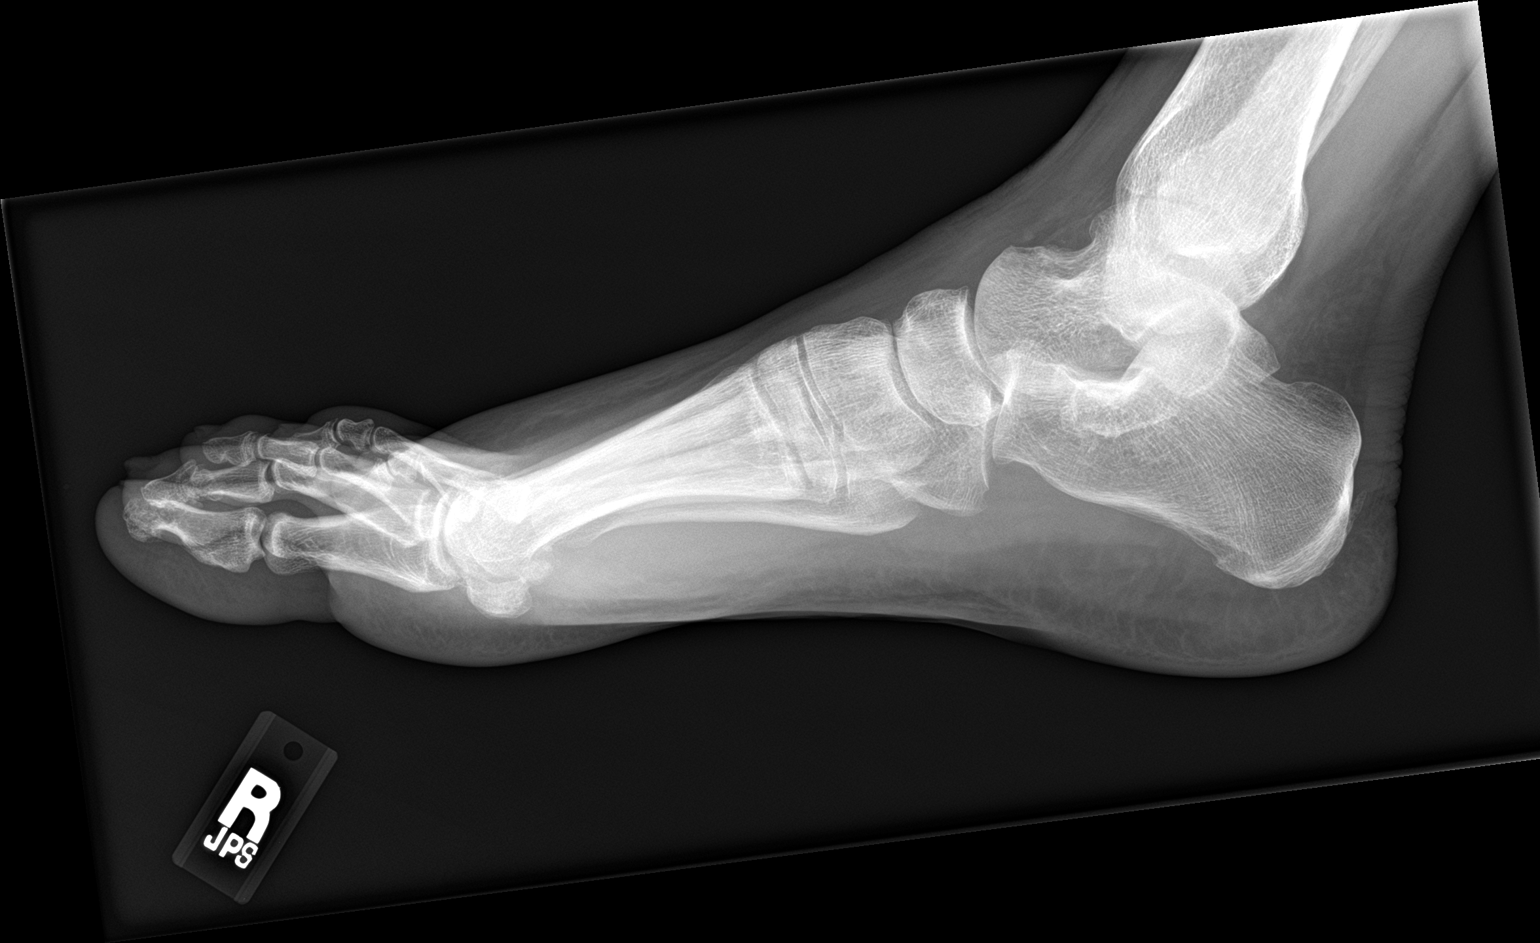

[toe lat]
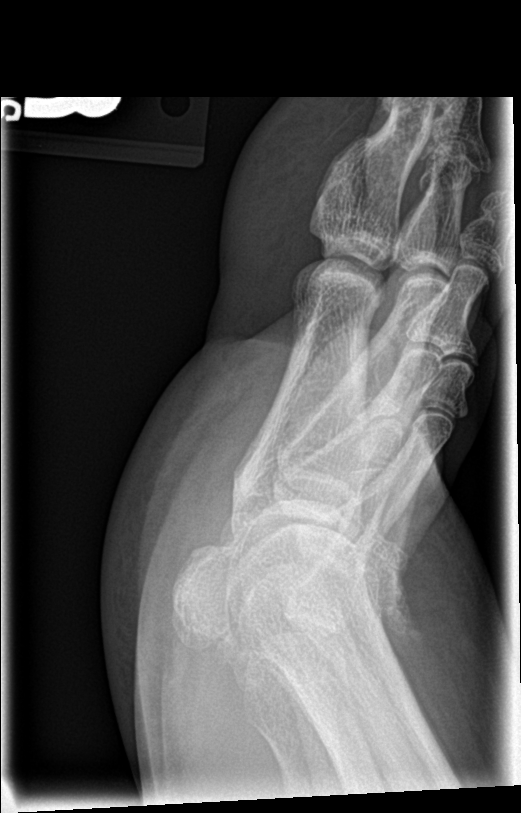

[4 of 4 positions shown; findings below may reference images not displayed]

FINDINGS: Frontal, oblique, and lateral views were obtained. There is a
fracture along the distal aspect of the first distal phalanx with
displacement of fracture fragments in this area. In addition, there
are fractures along the proximal aspects of each first distal
phalanx. No other fractures are evident. No dislocation. There is
moderate narrowing of the first MTP joint with spurring. There is
mild spurring in the second DIP joint. There is also mild spurring
in the first IP joint. No erosive change.
IMPRESSION: Fractures along the proximal and distal aspects of the first distal
phalanx. Mild displacement of fracture fragments noted. No other
fractures are evident. No dislocation. Osteoarthritic change in the
first MTP joint, second DIP joint, and first IP joint.

## 2021-08-25 DIAGNOSIS — Z0000 Encounter for general adult medical examination without abnormal findings: Secondary | ICD-10-CM | POA: Diagnosis not present

## 2021-08-25 DIAGNOSIS — G250 Essential tremor: Secondary | ICD-10-CM | POA: Diagnosis not present

## 2021-08-25 DIAGNOSIS — E782 Mixed hyperlipidemia: Secondary | ICD-10-CM | POA: Diagnosis not present

## 2021-08-25 DIAGNOSIS — K219 Gastro-esophageal reflux disease without esophagitis: Secondary | ICD-10-CM | POA: Diagnosis not present

## 2022-06-08 DIAGNOSIS — H5213 Myopia, bilateral: Secondary | ICD-10-CM | POA: Diagnosis not present

## 2022-06-13 DIAGNOSIS — Z0100 Encounter for examination of eyes and vision without abnormal findings: Secondary | ICD-10-CM | POA: Diagnosis not present

## 2022-08-31 DIAGNOSIS — Z0000 Encounter for general adult medical examination without abnormal findings: Secondary | ICD-10-CM | POA: Diagnosis not present

## 2022-08-31 DIAGNOSIS — Z125 Encounter for screening for malignant neoplasm of prostate: Secondary | ICD-10-CM | POA: Diagnosis not present

## 2022-08-31 DIAGNOSIS — E782 Mixed hyperlipidemia: Secondary | ICD-10-CM | POA: Diagnosis not present

## 2022-09-01 DIAGNOSIS — Z0000 Encounter for general adult medical examination without abnormal findings: Secondary | ICD-10-CM | POA: Diagnosis not present

## 2022-09-07 DIAGNOSIS — K219 Gastro-esophageal reflux disease without esophagitis: Secondary | ICD-10-CM | POA: Diagnosis not present

## 2022-09-07 DIAGNOSIS — G250 Essential tremor: Secondary | ICD-10-CM | POA: Diagnosis not present

## 2022-09-07 DIAGNOSIS — E782 Mixed hyperlipidemia: Secondary | ICD-10-CM | POA: Diagnosis not present

## 2022-09-07 DIAGNOSIS — Z0000 Encounter for general adult medical examination without abnormal findings: Secondary | ICD-10-CM | POA: Diagnosis not present

## 2023-02-22 DIAGNOSIS — K080 Exfoliation of teeth due to systemic causes: Secondary | ICD-10-CM | POA: Diagnosis not present

## 2023-09-13 DIAGNOSIS — G250 Essential tremor: Secondary | ICD-10-CM | POA: Diagnosis not present

## 2023-09-13 DIAGNOSIS — R5383 Other fatigue: Secondary | ICD-10-CM | POA: Diagnosis not present

## 2023-09-13 DIAGNOSIS — Z125 Encounter for screening for malignant neoplasm of prostate: Secondary | ICD-10-CM | POA: Diagnosis not present

## 2023-09-13 DIAGNOSIS — K219 Gastro-esophageal reflux disease without esophagitis: Secondary | ICD-10-CM | POA: Diagnosis not present

## 2023-09-13 DIAGNOSIS — E782 Mixed hyperlipidemia: Secondary | ICD-10-CM | POA: Diagnosis not present

## 2023-09-16 DIAGNOSIS — G4452 New daily persistent headache (NDPH): Secondary | ICD-10-CM | POA: Diagnosis not present

## 2023-09-16 DIAGNOSIS — S0990XA Unspecified injury of head, initial encounter: Secondary | ICD-10-CM | POA: Diagnosis not present

## 2023-09-20 ENCOUNTER — Other Ambulatory Visit: Payer: Self-pay | Admitting: Internal Medicine

## 2023-09-20 DIAGNOSIS — G4452 New daily persistent headache (NDPH): Secondary | ICD-10-CM

## 2023-09-20 DIAGNOSIS — K080 Exfoliation of teeth due to systemic causes: Secondary | ICD-10-CM | POA: Diagnosis not present

## 2023-09-20 DIAGNOSIS — Z0000 Encounter for general adult medical examination without abnormal findings: Secondary | ICD-10-CM | POA: Diagnosis not present

## 2023-09-23 ENCOUNTER — Ambulatory Visit
Admission: RE | Admit: 2023-09-23 | Discharge: 2023-09-23 | Disposition: A | Discharge status: Home / Self Care | Payer: Medicare Other | Source: Ambulatory Visit | Attending: Internal Medicine | Admitting: Internal Medicine

## 2023-09-23 DIAGNOSIS — R519 Headache, unspecified: Secondary | ICD-10-CM | POA: Diagnosis not present

## 2023-09-23 DIAGNOSIS — G4452 New daily persistent headache (NDPH): Secondary | ICD-10-CM

## 2024-04-04 DIAGNOSIS — K080 Exfoliation of teeth due to systemic causes: Secondary | ICD-10-CM | POA: Diagnosis not present

## 2024-09-18 DIAGNOSIS — E782 Mixed hyperlipidemia: Secondary | ICD-10-CM | POA: Diagnosis not present

## 2024-09-18 DIAGNOSIS — R5383 Other fatigue: Secondary | ICD-10-CM | POA: Diagnosis not present

## 2024-09-18 DIAGNOSIS — Z0000 Encounter for general adult medical examination without abnormal findings: Secondary | ICD-10-CM | POA: Diagnosis not present

## 2024-09-18 DIAGNOSIS — Z125 Encounter for screening for malignant neoplasm of prostate: Secondary | ICD-10-CM | POA: Diagnosis not present
